# Patient Record
Sex: Female | Born: 1965 | Race: Black or African American | Hispanic: No | Marital: Married | Smoking: Never smoker
Health system: Southern US, Community
[De-identification: ages and names within clinical notes are randomized; demographics above are authoritative.]

## PROBLEM LIST (undated history)

## (undated) DIAGNOSIS — R031 Nonspecific low blood-pressure reading: Secondary | ICD-10-CM

---

## 2014-06-29 ENCOUNTER — Emergency Department (HOSPITAL_COMMUNITY): Payer: No Typology Code available for payment source

## 2014-06-29 ENCOUNTER — Emergency Department (HOSPITAL_COMMUNITY)
Admission: EM | Admit: 2014-06-29 | Discharge: 2014-06-29 | Disposition: A | Discharge status: Home / Self Care | Payer: No Typology Code available for payment source | Attending: Emergency Medicine | Admitting: Emergency Medicine

## 2014-06-29 ENCOUNTER — Encounter (HOSPITAL_COMMUNITY): Payer: Self-pay | Admitting: Emergency Medicine

## 2014-06-29 DIAGNOSIS — S0990XA Unspecified injury of head, initial encounter: Secondary | ICD-10-CM

## 2014-06-29 DIAGNOSIS — S0993XA Unspecified injury of face, initial encounter: Secondary | ICD-10-CM | POA: Insufficient documentation

## 2014-06-29 DIAGNOSIS — Y9289 Other specified places as the place of occurrence of the external cause: Secondary | ICD-10-CM | POA: Insufficient documentation

## 2014-06-29 DIAGNOSIS — Y9389 Activity, other specified: Secondary | ICD-10-CM | POA: Insufficient documentation

## 2014-06-29 DIAGNOSIS — W208XXA Other cause of strike by thrown, projected or falling object, initial encounter: Secondary | ICD-10-CM | POA: Insufficient documentation

## 2014-06-29 DIAGNOSIS — S199XXA Unspecified injury of neck, initial encounter: Secondary | ICD-10-CM

## 2014-06-29 DIAGNOSIS — Z79899 Other long term (current) drug therapy: Secondary | ICD-10-CM | POA: Insufficient documentation

## 2014-06-29 HISTORY — DX: Nonspecific low blood-pressure reading: R03.1

## 2014-06-29 NOTE — ED Provider Notes (Signed)
CSN: [id number]     Arrival date & time 06/29/14  1052 History   First MD Initiated Contact with Patient 06/29/14 1415     Chief Complaint  Patient presents with  . Head Injury    The patient said she was moving into an apartment and a wall colapsed on top of her.  She denies LOC but says she has had pain all night.     (Consider location/radiation/quality/duration/timing/severity/associated sxs/prior Treatment) HPI  Jenny Franklin is a 48 y.o. female who states that she was injured yesterday, when a small wall, fell on her, striking her head, and left shoulder. It did not knock her out. Since then. She has had increased pain at these sites, and has noticed some ringing in her ears bilaterally. She is able to walk. She denies weakness, dizziness, paresthesias, nausea, or vomiting. There are no other known modifying factors.   Past Medical History  Diagnosis Date  . Low blood pressure reading    History reviewed. No pertinent past surgical history. History reviewed. No pertinent family history. History  Substance Use Topics  . Smoking status: Never Smoker   . Smokeless tobacco: Never Used  . Alcohol Use: No   OB History   Grav Para Term Preterm Abortions TAB SAB Ect Mult Living                 Review of Systems  All other systems reviewed and are negative.     Allergies  Codeine and Sulfa antibiotics  Home Medications   Prior to Admission medications   Medication Sig Start Date End Date Taking? Authorizing Provider  Multiple Vitamin (MULTIVITAMIN WITH MINERALS) TABS tablet Take 1 tablet by mouth daily.   Yes Historical Provider, MD  naproxen sodium (ALEVE) 220 MG tablet Take 220 mg by mouth 2 (two) times daily as needed (for pain).   Yes Historical Provider, MD   BP 123/82  Pulse 80  SpO2 100%  LMP 06/22/2014 Physical Exam  Nursing note and vitals reviewed. Constitutional: She is oriented to person, place, and time. She appears well-developed and well-nourished.   HENT:  Head: Normocephalic and atraumatic.  Tender left parietal region scalp without deformity, laceration or abrasion. External auditory canals and tympanic membranes are normal bilaterally.  Eyes: Conjunctivae and EOM are normal. Pupils are equal, round, and reactive to light. Right eye exhibits no discharge. Left eye exhibits no discharge.  Neck: Normal range of motion and phonation normal. Neck supple.  Cardiovascular: Normal rate, regular rhythm and intact distal pulses.   Pulmonary/Chest: Effort normal and breath sounds normal. She exhibits no tenderness.  Abdominal: Soft. She exhibits no distension. There is no tenderness. There is no guarding.  Musculoskeletal: Normal range of motion.  Tender posterior cervical spine region without deformity or step-off. Left shoulder without deformity or significant pain with palpation.  Neurological: She is alert and oriented to person, place, and time. She exhibits normal muscle tone.  Normal gait. No dysarthria or aphasia.  Skin: Skin is warm and dry.  Psychiatric: She has a normal mood and affect. Her behavior is normal. Judgment and thought content normal.    ED Course  Procedures (including critical care time) Labs Review Labs Reviewed - No data to display  Imaging Review Ct Head Wo Contrast  06/29/2014   CLINICAL DATA:  Pain.  Trauma.  EXAM: CT HEAD WITHOUT CONTRAST  CT CERVICAL SPINE WITHOUT CONTRAST  TECHNIQUE: Multidetector CT imaging of the head and cervical spine was performed following the standard  protocol without intravenous contrast. Multiplanar CT image reconstructions of the cervical spine were also generated.  COMPARISON:  None.  FINDINGS: CT HEAD FINDINGS  No intra-axial or extra-axial pathologic fluid or blood collection. No mass. No hydrocephalus. No hemorrhage. Orbits are unremarkable. Paranasal sinuses and mastoids are clear. No acute bony abnormality.  CT CERVICAL SPINE FINDINGS  No soft tissue swelling. Multiple shotty  cervical lymph nodes are noted. Apical pleural parenchymal thickening noted consistent with scarring. No evidence fracture dislocation. Normal bony alignment.  IMPRESSION: 1. No acute intracranial abnormality. 2. No acute cervical spine abnormality .   Electronically Signed   By: Maisie Fus  Register   On: 06/29/2014 14:55   Ct Cervical Spine Wo Contrast  06/29/2014   CLINICAL DATA:  Pain.  Trauma.  EXAM: CT HEAD WITHOUT CONTRAST  CT CERVICAL SPINE WITHOUT CONTRAST  TECHNIQUE: Multidetector CT imaging of the head and cervical spine was performed following the standard protocol without intravenous contrast. Multiplanar CT image reconstructions of the cervical spine were also generated.  COMPARISON:  None.  FINDINGS: CT HEAD FINDINGS  No intra-axial or extra-axial pathologic fluid or blood collection. No mass. No hydrocephalus. No hemorrhage. Orbits are unremarkable. Paranasal sinuses and mastoids are clear. No acute bony abnormality.  CT CERVICAL SPINE FINDINGS  No soft tissue swelling. Multiple shotty cervical lymph nodes are noted. Apical pleural parenchymal thickening noted consistent with scarring. No evidence fracture dislocation. Normal bony alignment.  IMPRESSION: 1. No acute intracranial abnormality. 2. No acute cervical spine abnormality .   Electronically Signed   By: Maisie Fus  Register   On: 06/29/2014 14:55     EKG Interpretation None      MDM   Final diagnoses:  Head injury, initial encounter  Neck injury, initial encounter    Head and neck injury, secondary to walk collapse. No acute injuries on imaging and minimal findings on clinical exam.  Nursing Notes Reviewed/ Care Coordinated Applicable Imaging Reviewed Interpretation of Laboratory Data incorporated into ED treatment  The patient appears reasonably screened and/or stabilized for discharge and I doubt any other medical condition or other [facility] requiring further screening, evaluation, or treatment in the ED at this time prior to  discharge.  Plan: Home Medications- ibuprofen when necessary; Home Treatments- rest; return here if the recommended treatment, does not improve the symptoms; Recommended follow up- PCP of choice when necessary   Flint Melter, MD 06/29/14 [phone]

## 2014-06-29 NOTE — ED Notes (Signed)
Called for triage x3 with no response 

## 2014-06-29 NOTE — ED Notes (Signed)
Pt requesting to speak with doctor re: further questions on her CT scan

## 2014-06-29 NOTE — ED Notes (Signed)
Returned from ct scan 

## 2014-06-29 NOTE — ED Notes (Signed)
Jenny Shy, MD notified that pt is requesting to speak with him prior to discharge

## 2014-06-29 NOTE — Discharge Instructions (Signed)
Use ice to the sore spots 3 or 4 times a day, for 2 days. Take ibuprofen, 400 mg, 3 times a day as needed, for pain.     Cervical Sprain A cervical sprain is when the tissues (ligaments) that hold the neck bones in place stretch or tear. HOME CARE   Put ice on the injured area.  Put ice in a plastic bag.  Place a towel between your skin and the bag.  Leave the ice on for 15-20 minutes, 3-4 times a day.  You may have been given a collar to wear. This collar keeps your neck from moving while you heal.  Do not take the collar off unless told by your doctor.  If you have long hair, keep it outside of the collar.  Ask your doctor before changing the position of your collar. You may need to change its position over time to make it more comfortable.  If you are allowed to take off the collar for cleaning or bathing, follow your doctor's instructions on how to do it safely.  Keep your collar clean by wiping it with mild soap and water. Dry it completely. If the collar has removable pads, remove them every 1-2 days to hand wash them with soap and water. Allow them to air dry. They should be dry before you wear them in the collar.  Do not drive while wearing the collar.  Only take medicine as told by your doctor.  Keep all doctor visits as told.  Keep all physical therapy visits as told.  Adjust your work station so that you have good posture while you work.  Avoid positions and activities that make your problems worse.  Warm up and stretch before being active. GET HELP IF:  Your pain is not controlled with medicine.  You cannot take less pain medicine over time as planned.  Your activity level does not improve as expected. GET HELP RIGHT AWAY IF:   You are bleeding.  Your stomach is upset.  You have an allergic reaction to your medicine.  You develop new problems that you cannot explain.  You lose feeling (become numb) or you cannot move any part of your body  (paralysis).  You have tingling or weakness in any part of your body.  Your symptoms get worse. Symptoms include:  Pain, soreness, stiffness, puffiness (swelling), or a burning feeling in your neck.  Pain when your neck is touched.  Shoulder or upper back pain.  Limited ability to move your neck.  Headache.  Dizziness.  Your hands or arms feel week, lose feeling, or tingle.  Muscle spasms.  Difficulty swallowing or chewing. MAKE SURE YOU:   Understand these instructions.  Will watch your condition.  Will get help right away if you are not doing well or get worse. Document Released: 03/31/2008 Document Revised: 06/15/2013 Document Reviewed: 04/20/2013 [facility] Patient Information 2015 [place], [place]. This information is not intended to replace advice given to you by your health care provider. Make sure you discuss any questions you have with your health care provider.  Head Injury You have a head injury. Headaches and throwing up (vomiting) are common after a head injury. It should be easy to wake up from sleeping. Sometimes you must stay in the hospital. Most problems happen within the first 24 hours. Side effects may occur up to 7-10 days after the injury.  WHAT ARE THE TYPES OF HEAD INJURIES? Head injuries can be as minor as a bump. Some head injuries  can be more severe. More severe head injuries include:  A jarring injury to the brain (concussion).  A bruise of the brain (contusion). This mean there is bleeding in the brain that can cause swelling.  A cracked skull (skull fracture).  Bleeding in the brain that collects, clots, and forms a bump (hematoma). WHEN SHOULD I GET HELP RIGHT AWAY?   You are confused or sleepy.  You cannot be woken up.  You feel sick to your stomach (nauseous) or keep throwing up (vomiting).  Your dizziness or unsteadiness is getting worse.  You have very bad, lasting headaches that are not helped by medicine. Take medicines only as  told by your doctor.  You cannot use your arms or legs like normal.  You cannot walk.  You notice changes in the black spots in the center of the colored part of your eye (pupil).  You have clear or bloody fluid coming from your nose or ears.  You have trouble seeing. During the next 24 hours after the injury, you must stay with someone who can watch you. This person should get help right away (call 911 in the [place] if you start to shake and are not able to control it (have seizures), you pass out, or you are unable to wake up. HOW CAN I PREVENT A HEAD INJURY IN THE FUTURE?  Wear seat belts.  Wear a helmet while bike riding and playing sports like football.  Stay away from dangerous activities around the house. WHEN CAN I RETURN TO NORMAL ACTIVITIES AND ATHLETICS? See your doctor before doing these activities. You should not do normal activities or play contact sports until 1 week after the following symptoms have stopped:  Headache that does not go away.  Dizziness.  Poor attention.  Confusion.  Memory problems.  Sickness to your stomach or throwing up.  Tiredness.  Fussiness.  Bothered by bright lights or loud noises.  Anxiousness or depression.  Restless sleep. MAKE SURE YOU:   Understand these instructions.  Will watch your condition.  Will get help right away if you are not doing well or get worse. Document Released: 09/25/2008 Document Revised: 02/27/2014 Document Reviewed: 06/20/2013 [facility] Patient Information 2015 [place], [place]. This information is not intended to replace advice given to you by your health care provider. Make sure you discuss any questions you have with your health care provider.

## 2014-06-29 NOTE — ED Notes (Signed)
The patient said she was moving into an apartment and a wall colapsed on top of her.  She denies LOC but says she has had pain all night.  She rates her pain 10/10 in her head and 10/10.  She has taken some aleve with no relief.  She went to a urgent care on [address] and they told her she should come here.  The patient is walking with no problems and she was able to drive herself here.

## 2014-06-29 NOTE — ED Notes (Signed)
Wentz, MD at bedside.  

## 2014-06-29 NOTE — ED Notes (Signed)
Pt returned to ED states that she was in the cafeteria and did not know that her pager would not reach.

## 2016-04-27 IMAGING — CT CT HEAD W/O CM
3 of 6 series · 13 of 47 positions shown, 15 images · non-contrast
Comparison: None.

CLINICAL DATA: Pain.  Trauma.

EXAM:
CT HEAD WITHOUT CONTRAST
CT CERVICAL SPINE WITHOUT CONTRAST
TECHNIQUE: Multidetector CT imaging of the head and cervical spine was
performed following the standard protocol without intravenous
contrast. Multiplanar CT image reconstructions of the cervical spine
were also generated.

[Series 304: orthog · axial · 0.27mm/px · z∈[+101,+228]mm · 7 of 88 slices shown, 9 images]
[im 11/88  brain]
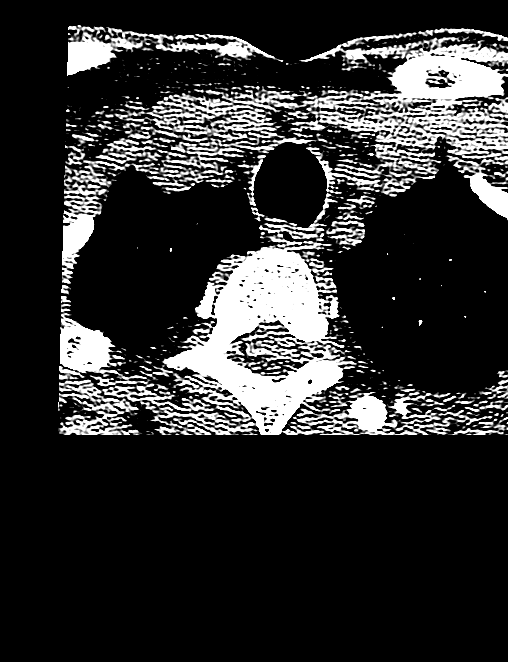
[im 11/88  bone]
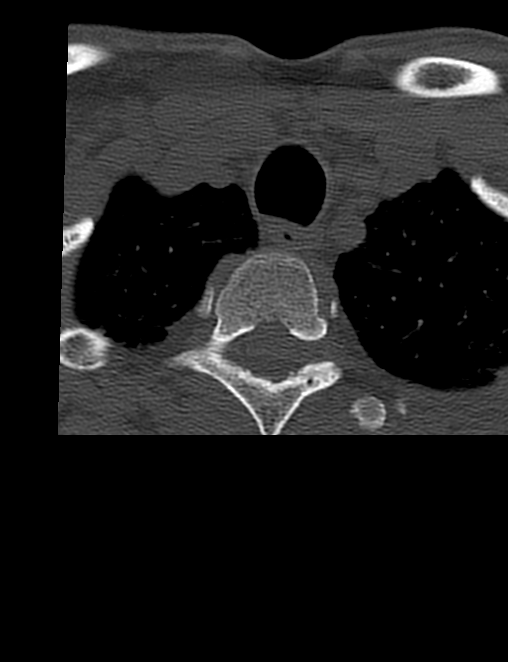
[im 22/88  brain]
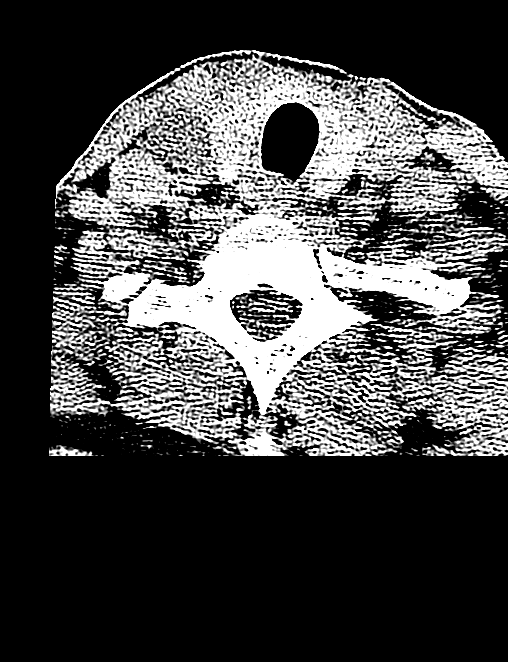
[im 33/88  brain]
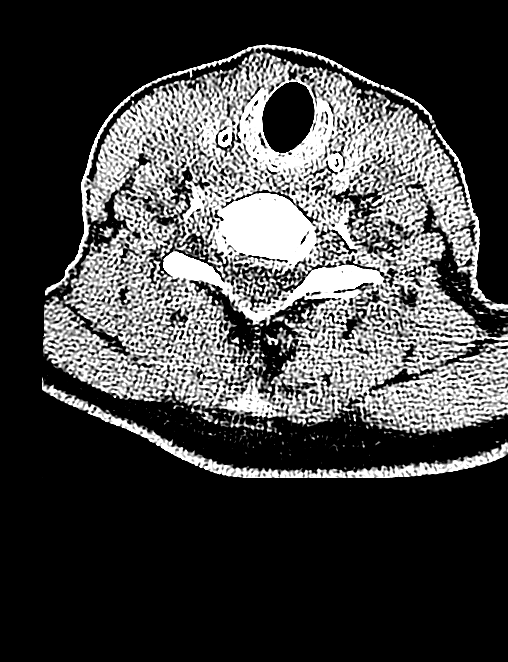
[im 44/88  brain]
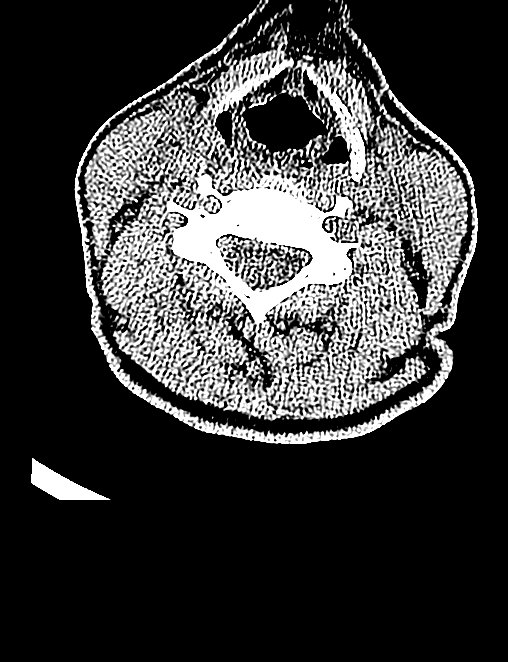
[im 55/88  brain]
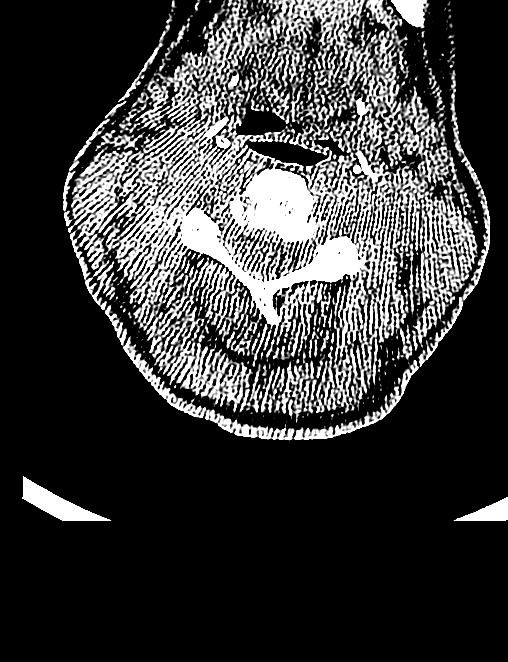
[im 55/88  bone]
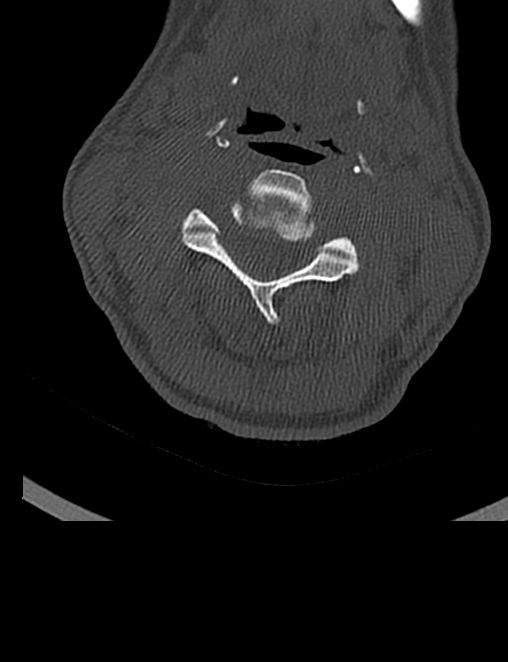
[im 66/88  brain]
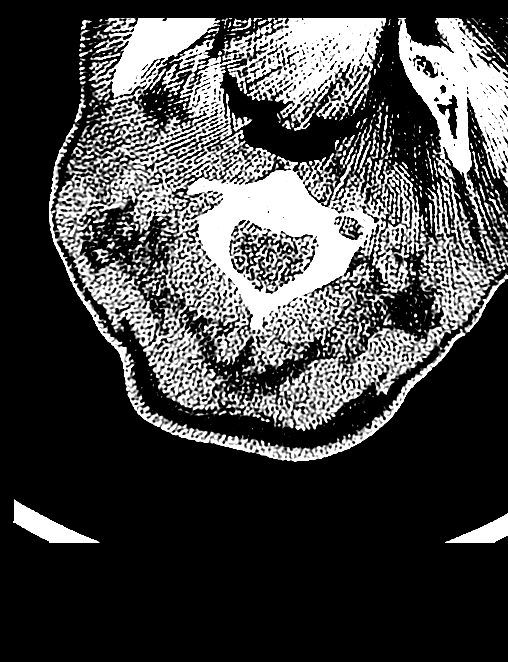
[im 77/88  brain]
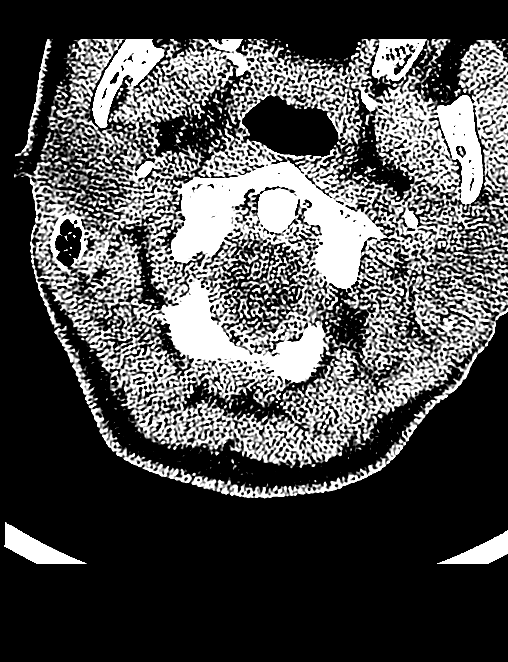

[Series 305: cor · coronal · 0.27mm/px · 3 of 33 slices shown]
[im 11/33  brain]
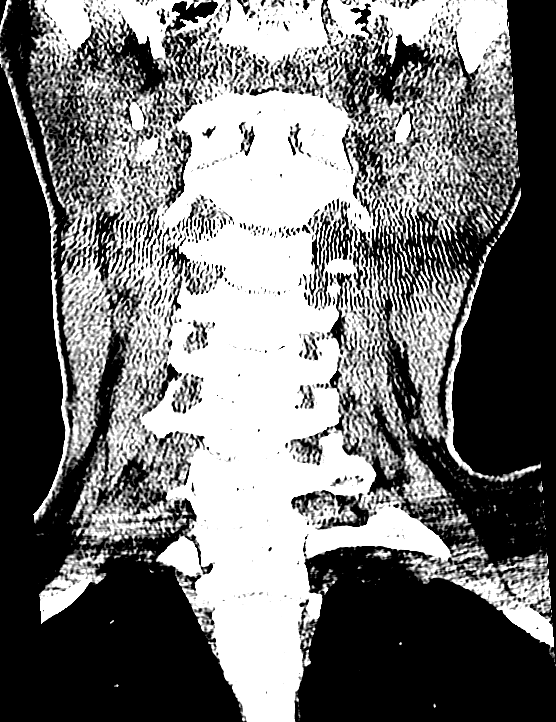
[im 15/33  brain]
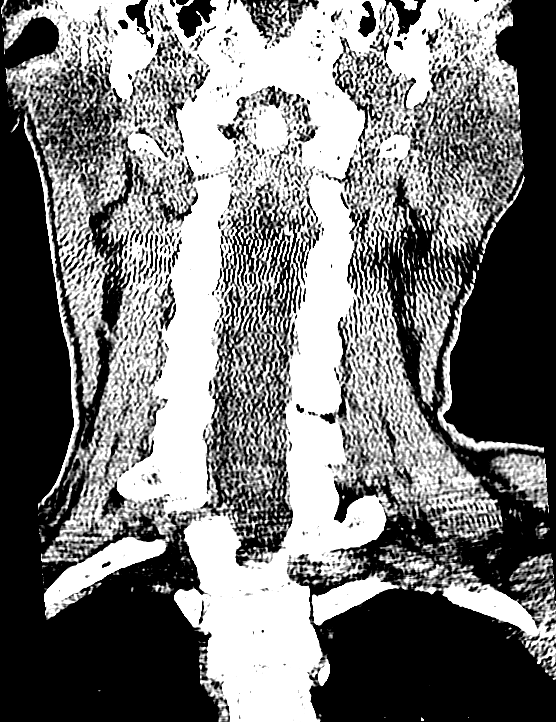
[im 18/33  brain]
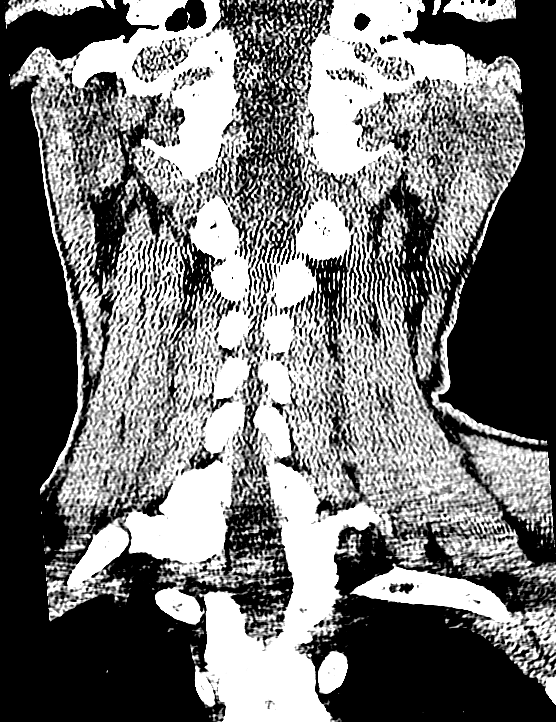

[Series 306: sag · sagittal · 0.27mm/px · 3 of 40 slices shown]
[im 14/40  brain]
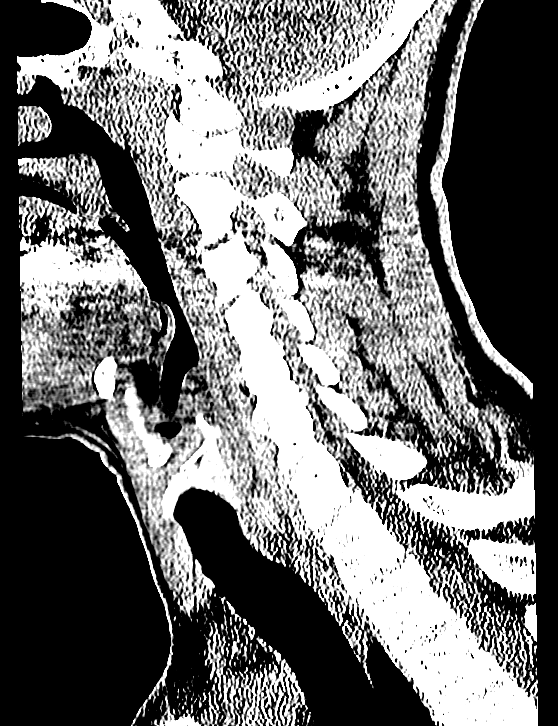
[im 20/40  brain]
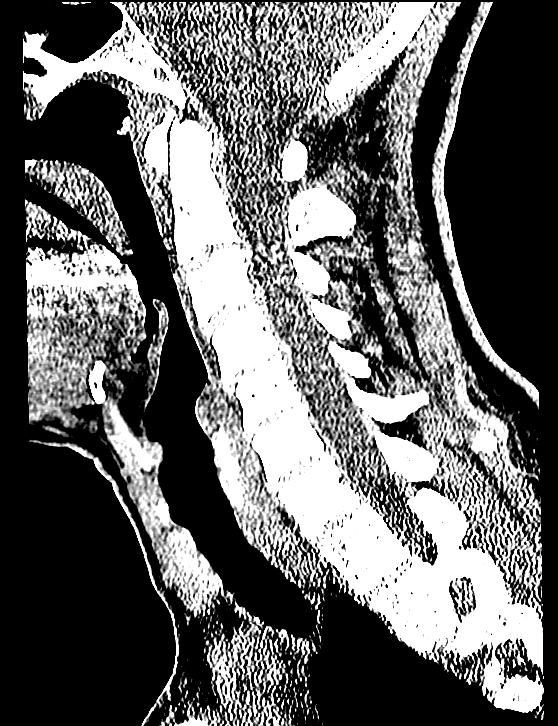
[im 27/40  brain]
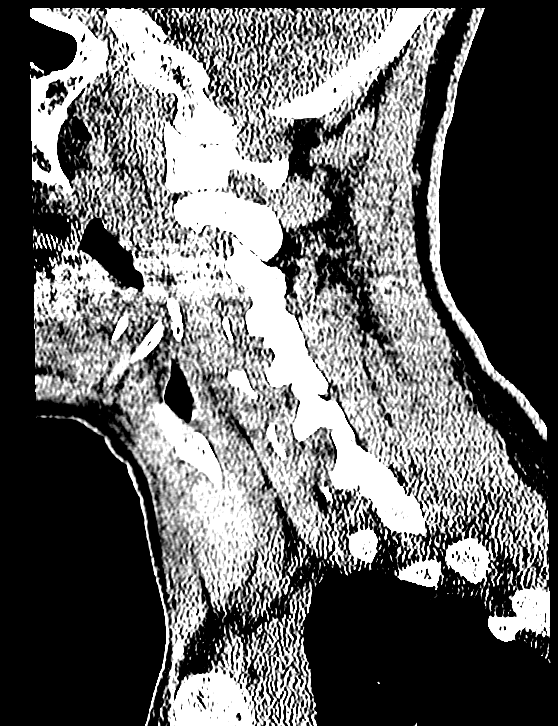

[13 of 47 positions shown; findings below may reference images not displayed]

FINDINGS: CT HEAD FINDINGS

No intra-axial or extra-axial pathologic fluid or blood collection.
No mass. No hydrocephalus. No hemorrhage. Orbits are unremarkable.
Paranasal sinuses and mastoids are clear. No acute bony abnormality.

CT CERVICAL SPINE FINDINGS

No soft tissue swelling. Multiple shotty cervical lymph nodes are
noted. Apical pleural parenchymal thickening noted consistent with
scarring. No evidence fracture dislocation. Normal bony alignment.
IMPRESSION: 1. No acute intracranial abnormality.
2. No acute cervical spine abnormality .
